# Patient Record
Sex: Male | Born: 1990 | Race: Black or African American | Hispanic: No | Marital: Single | Smoking: Never smoker
Health system: Southern US, Community
[De-identification: ages and names within clinical notes are randomized; demographics above are authoritative.]

## PROBLEM LIST (undated history)

## (undated) DIAGNOSIS — R569 Unspecified convulsions: Secondary | ICD-10-CM

## (undated) HISTORY — PX: HERNIA REPAIR: SHX51

---
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
## 2015-04-20 ENCOUNTER — Emergency Department (HOSPITAL_BASED_OUTPATIENT_CLINIC_OR_DEPARTMENT_OTHER)
Admission: EM | Admit: 2015-04-20 | Discharge: 2015-04-20 | Disposition: A | Discharge status: Home / Self Care | Payer: Managed Care, Other (non HMO) | Attending: Emergency Medicine | Admitting: Emergency Medicine

## 2015-04-20 ENCOUNTER — Encounter (HOSPITAL_BASED_OUTPATIENT_CLINIC_OR_DEPARTMENT_OTHER): Payer: Self-pay

## 2015-04-20 DIAGNOSIS — R4781 Slurred speech: Secondary | ICD-10-CM | POA: Diagnosis not present

## 2015-04-20 DIAGNOSIS — Z79899 Other long term (current) drug therapy: Secondary | ICD-10-CM | POA: Insufficient documentation

## 2015-04-20 DIAGNOSIS — R42 Dizziness and giddiness: Secondary | ICD-10-CM | POA: Diagnosis not present

## 2015-04-20 DIAGNOSIS — R5383 Other fatigue: Secondary | ICD-10-CM | POA: Diagnosis not present

## 2015-04-20 DIAGNOSIS — Z9889 Other specified postprocedural states: Secondary | ICD-10-CM | POA: Diagnosis not present

## 2015-04-20 DIAGNOSIS — R531 Weakness: Secondary | ICD-10-CM | POA: Diagnosis not present

## 2015-04-20 DIAGNOSIS — R400 Somnolence: Secondary | ICD-10-CM | POA: Insufficient documentation

## 2015-04-20 DIAGNOSIS — R112 Nausea with vomiting, unspecified: Secondary | ICD-10-CM | POA: Diagnosis not present

## 2015-04-20 DIAGNOSIS — R51 Headache: Secondary | ICD-10-CM | POA: Diagnosis present

## 2015-04-20 HISTORY — DX: Unspecified convulsions: R56.9

## 2015-04-20 NOTE — Discharge Instructions (Signed)

## 2015-04-20 NOTE — ED Notes (Signed)
Pt states on Monday he felt like he had a virus and had a h/a, nausea and blurred vision.  Denies current complaints, but states he has slurred words currently, which are not appreciated upon exam.  Pt had not taken his keppra in three weeks for unknown reason, states he took his dose today though.

## 2015-04-20 NOTE — ED Provider Notes (Signed)
CSN: [id number]     Arrival date & time 04/20/15  1954 History   First MD Initiated Contact with Patient 04/20/15 2138     Chief Complaint  Patient presents with  . Headache    HPI   25 year old male presents today with numerous complaints. Patient reports that on Monday he had a stomach virus with nausea vomiting and globalized headache. He reports symptoms aside the next day, continued to feel fatigued. Patient denies any fever, neck stiffness, headache, any neurological deficits with the headache. Patient reports that he just recently got his prescription for Keppra took his first dose of 1500 mg this morning. He reports that after that he had some dizziness, drowsiness, and mother reported some slurred speech. Patient denies any focal neurological deficits, reports that he does not feel this is like his previous seizures, denies any fever, headache, neck stiffness. Drug or alcohol use.   Past Medical History  Diagnosis Date  . Seizures (HCC)     last seizure was 2016   Past Surgical History  Procedure Laterality Date  . Hernia repair     No family history on file. Social History  Substance Use Topics  . Smoking status: None  . Smokeless tobacco: None  . Alcohol Use: None     Review of Systems  All other systems reviewed and are negative.   Allergies  Review of patient's allergies indicates no known allergies.  Home Medications   Prior to Admission medications   Medication Sig Start Date End Date Taking? Authorizing Provider  levETIRAcetam (KEPPRA) 750 MG tablet Take 1,500 mg by mouth 2 (two) times daily.   Yes Historical Provider, MD   BP 124/86 mmHg  Pulse 68  Temp(Src) 98.6 F (37 C) (Oral)  Resp 18  Ht  (1.778 m)  Wt 66.225 kg  BMI 20.95 kg/m2  SpO2 100%   Physical Exam  Constitutional: He is oriented to person, place, and time. He appears well-developed and well-nourished.  HENT:  Head: Normocephalic and atraumatic.  Eyes: Conjunctivae are normal.  Pupils are equal, round, and reactive to light. Right eye exhibits no discharge. Left eye exhibits no discharge. No scleral icterus.  Neck: Normal range of motion. No JVD present. No tracheal deviation present.  Pulmonary/Chest: Effort normal. No stridor.  Neurological: He is alert and oriented to person, place, and time. He has normal strength. No cranial nerve deficit or sensory deficit. Coordination normal. GCS eye subscore is 4. GCS verbal subscore is 5. GCS motor subscore is 6.  Reflex Scores:      Patellar reflexes are 2+ on the right side and 2+ on the left side. Psychiatric: He has a normal mood and affect. His behavior is normal. Judgment and thought content normal.  Nursing note and vitals reviewed.   ED Course  Procedures (including critical care time) Labs Review Labs Reviewed - No data to display  Imaging Review No results found. I have personally reviewed and evaluated these images and lab results as part of my medical decision-making.   EKG Interpretation None      MDM   Final diagnoses:  Weakness    Labs:   Imaging:  Consults:  Therapeutics:  Discharge Meds:   Assessment/Plan: 25 year old male presents today with vague complaints. Today's presentation is most likely due to patient restarting his Keppra at home. He was originally started on Keppra and titrated up to the current dose that he took today. He has no focal neurological deficits that would indicate any  intracranial abnormality, acutely life-threatening illness. Patient has no seizure-like activity, is afebrile and nontoxic in no acute distress. I cannot appreciate any slurred speech during my exam, or any other abnormalities. Patient is instructed follow-up with his neurologist tomorrow regarding Keppra dosing, scheduled for evaluation. He is given strict return, patient and his mother verbalized understanding and agreement today's plan and had no further questions or concerns at the time of  discharge. Patient is instructed not to drive up despite date in any potentially harmful activities until he returns to his baseline.        Eyvonne Mechanic, PA-C 04/20/15 2253  Loren Racer, MD 04/23/15 2249

## 2023-01-02 ENCOUNTER — Emergency Department (HOSPITAL_BASED_OUTPATIENT_CLINIC_OR_DEPARTMENT_OTHER)
Admission: EM | Admit: 2023-01-02 | Discharge: 2023-01-02 | Disposition: A | Discharge status: Home / Self Care | Payer: 59 | Attending: Emergency Medicine | Admitting: Emergency Medicine

## 2023-01-02 ENCOUNTER — Emergency Department (HOSPITAL_BASED_OUTPATIENT_CLINIC_OR_DEPARTMENT_OTHER): Payer: 59

## 2023-01-02 ENCOUNTER — Other Ambulatory Visit: Payer: Self-pay

## 2023-01-02 ENCOUNTER — Encounter (HOSPITAL_BASED_OUTPATIENT_CLINIC_OR_DEPARTMENT_OTHER): Payer: Self-pay

## 2023-01-02 DIAGNOSIS — R569 Unspecified convulsions: Secondary | ICD-10-CM | POA: Insufficient documentation

## 2023-01-02 LAB — CBC WITH DIFFERENTIAL/PLATELET
Abs Immature Granulocytes: 0.12 10*3/uL — ABNORMAL HIGH (ref 0.00–0.07)
Basophils Absolute: 0.1 10*3/uL (ref 0.0–0.1)
Basophils Relative: 1 %
Eosinophils Absolute: 0.1 10*3/uL (ref 0.0–0.5)
Eosinophils Relative: 1 %
HCT: 45.6 % (ref 39.0–52.0)
Hemoglobin: 15.4 g/dL (ref 13.0–17.0)
Immature Granulocytes: 1 %
Lymphocytes Relative: 28 %
Lymphs Abs: 3 10*3/uL (ref 0.7–4.0)
MCH: 26.2 pg (ref 26.0–34.0)
MCHC: 33.8 g/dL (ref 30.0–36.0)
MCV: 77.7 fL — ABNORMAL LOW (ref 80.0–100.0)
Monocytes Absolute: 0.6 10*3/uL (ref 0.1–1.0)
Monocytes Relative: 6 %
Neutro Abs: 6.8 10*3/uL (ref 1.7–7.7)
Neutrophils Relative %: 63 %
Platelets: 376 10*3/uL (ref 150–400)
RBC: 5.87 MIL/uL — ABNORMAL HIGH (ref 4.22–5.81)
RDW: 13 % (ref 11.5–15.5)
WBC: 10.6 10*3/uL — ABNORMAL HIGH (ref 4.0–10.5)
nRBC: 0 % (ref 0.0–0.2)

## 2023-01-02 LAB — BASIC METABOLIC PANEL
Anion gap: 10 (ref 5–15)
BUN: 8 mg/dL (ref 6–20)
CO2: 27 mmol/L (ref 22–32)
Calcium: 9.4 mg/dL (ref 8.9–10.3)
Chloride: 99 mmol/L (ref 98–111)
Creatinine, Ser: 0.89 mg/dL (ref 0.61–1.24)
GFR, Estimated: >60 mL/min (ref >60)
Glucose, Bld: 140 mg/dL — ABNORMAL HIGH (ref 70–99)
Potassium: 3.5 mmol/L (ref 3.5–5.1)
Sodium: 136 mmol/L (ref 135–145)

## 2023-01-02 MED ORDER — LEVETIRACETAM 750 MG PO TABS: 750.0000 mg | ORAL_TABLET | Freq: Two times a day (BID) | ORAL | 0 refills | Status: DC

## 2023-01-02 MED ORDER — LEVETIRACETAM IN NACL 1000 MG/100ML IV SOLN
1000.0000 mg | Freq: Once | INTRAVENOUS | Status: AC
  Administered 2023-01-02: 1000 mg via INTRAVENOUS
  Filled 2023-01-02: qty 100

## 2023-01-02 NOTE — Discharge Instructions (Signed)

## 2023-01-02 NOTE — ED Provider Notes (Signed)
Emergency Department Provider Note   I have reviewed the triage vital signs and the nursing notes.   HISTORY  Chief Complaint Seizures   HPI Jimmy Ryan is a 32 y.o. male with past history of seizure, no longer on Keppra, presents to the emergency department with return of breakthrough seizures.  Over the past several weeks he has had several episodes of staring off for 1 to 2 minutes and then waking up very confused and disoriented.  He states in 2014 he had similar symptoms and was started on Keppra, 750 mg twice daily.  He tolerated this well and in 2021 had been seizure-free for some time.  He was taken off of medication and has not followed further with his neurologist.  3  weeks ago he had return of one episode which has since been followed by 2-3 more including one approximately 1 hour prior to ED presentation.  No numbness or weakness.  He does have a mild headache.  No fevers. He would like to re-start his Keppra and needs another referral to Neurology. He does drive but understands that with return of seizure he will need to stop driving immediately.    Past Medical History:  Diagnosis Date   Seizures (HCC)    last seizure was 2016    Review of Systems  Constitutional: No fever/chills Eyes: No visual changes. ENT: No sore throat. Cardiovascular: Denies chest pain. Respiratory: Denies shortness of breath. Gastrointestinal: No abdominal pain.  No nausea, no vomiting.  No diarrhea.  Musculoskeletal: Negative for back pain. Skin: Negative for rash. Neurological: Negative for headaches, focal weakness or numbness. Positive breakthrough seizure.    ____________________________________________   PHYSICAL EXAM:  VITAL SIGNS: ED Triage Vitals  Encounter Vitals Group     BP 01/02/23 2032 (!) 140/96     Pulse Rate 01/02/23 2032 (!) 105     Resp 01/02/23 2032 18     Temp 01/02/23 2032 98.2 F (36.8 C)     Temp Source 01/02/23 2032 Oral     SpO2 01/02/23 2032  98 %     Weight 01/02/23 2038 193 lb (87.5 kg)     Height 01/02/23 2038 5\' 9"  (1.753 m)   Constitutional: Alert and oriented. Well appearing and in no acute distress. Eyes: Conjunctivae are normal. Head: Atraumatic. Nose: No congestion/rhinnorhea. Mouth/Throat: Mucous membranes are moist.  Neck: No stridor.   Cardiovascular: Normal rate, regular rhythm. Good peripheral circulation. Grossly normal heart sounds.   Respiratory: Normal respiratory effort.  No retractions. Lungs CTAB. Gastrointestinal: Soft and nontender. No distention.  Musculoskeletal: No lower extremity tenderness nor edema. No gross deformities of extremities. Neurologic:  Normal speech and language. No gross focal neurologic deficits are appreciated.  Skin:  Skin is warm, dry and intact. No rash noted.  ____________________________________________   LABS (all labs ordered are listed, but only abnormal results are displayed)  Labs Reviewed  CBC WITH DIFFERENTIAL/PLATELET - Abnormal; Notable for the following components:      Result Value   WBC 10.6 (*)    RBC 5.87 (*)    MCV 77.7 (*)    Abs Immature Granulocytes 0.12 (*)    All other components within normal limits  BASIC METABOLIC PANEL - Abnormal; Notable for the following components:   Glucose, Bld 140 (*)    All other components within normal limits   ____________________________________________  EKG   EKG Interpretation Date/Time:  Wednesday January 02 2023 20:36:26 EDT Ventricular Rate:  96 PR Interval:  162  QRS Duration:  96 QT Interval:  339 QTC Calculation: 429 R Axis:   61  Text Interpretation: Incomplete analysis due to missing data in precordial lead(s) Sinus rhythm RSR' in V1 or V2, right VCD or RVH Baseline wander in lead(s) V3 Missing lead(s): V2 and partial missing lead(s): V3 Confirmed by Alona Bene [phone]) on 01/02/2023 8:45:26 PM        ____________________________________________  RADIOLOGY  CT Head Wo Contrast  Result  Date: 01/02/2023 CLINICAL DATA:  New onset seizure activity, initial encounter EXAM: CT HEAD WITHOUT CONTRAST TECHNIQUE: Contiguous axial images were obtained from the base of the skull through the vertex without intravenous contrast. RADIATION DOSE REDUCTION: This exam was performed according to the departmental dose-optimization program which includes automated exposure control, adjustment of the mA and/or kV according to patient size and/or use of iterative reconstruction technique. COMPARISON:  None Available. FINDINGS: Brain: No evidence of acute infarction, hemorrhage, hydrocephalus, extra-axial collection or mass lesion/mass effect. Vascular: No hyperdense vessel or unexpected calcification. Skull: Normal. Negative for fracture or focal lesion. Sinuses/Orbits: No acute finding. Other: None. IMPRESSION: No acute intracranial abnormality noted. Electronically Signed   By: Alcide Clever M.D.   On: 01/02/2023 22:48    ____________________________________________   PROCEDURES  Procedure(s) performed:   Procedures  None  ____________________________________________   INITIAL IMPRESSION / ASSESSMENT AND PLAN / ED COURSE  Pertinent labs & imaging results that were available during my care of the patient were reviewed by me and considered in my medical decision making (see chart for details).   This patient is Presenting for Evaluation of AMS, which does require a range of treatment options, and is a complaint that involves a high risk of morbidity and mortality.  The Differential Diagnoses includes but is not exclusive to alcohol, illicit or prescription medications, intracranial pathology such as stroke, intracerebral hemorrhage, fever or infectious causes including sepsis, hypoxemia, uremia, trauma, endocrine related disorders such as diabetes, hypoglycemia, thyroid-related diseases, etc.   Critical Interventions-    Medications  levETIRAcetam (KEPPRA) IVPB 1000 mg/100 mL premix (0 mg  Intravenous Stopped 01/02/23 2133)    Reassessment after intervention: patient awake and alert.    Clinical Laboratory Tests Ordered, included CBC without anemia. No AKI.   Radiologic Tests Ordered, included CT head. I independently interpreted the images and agree with radiology interpretation.   Cardiac Monitor Tracing which shows NSR.    Social Determinants of Health Risk denies any drugs or EtOH.   Medical Decision Making: Summary:  Patient presents emergency department with breakthrough seizures intermittently over the past 2 to 3 weeks.  He was previously on Keppra 750 mg twice daily.  Plan to restart this with Keppra load here.  Will refer to neurology.  Reevaluation with update and discussion with patient. Plan to restart Keppra at 750 mg BID, patient's reported prior dose, and refer to Cardiology. Patient given strict no driving precautions.   Considered admission but no evidence of status epilepticus. Stable for discharge.   Patient's presentation is most consistent with acute presentation with potential threat to life or bodily function.   Disposition: discharge  ____________________________________________  FINAL CLINICAL IMPRESSION(S) / ED DIAGNOSES  Final diagnoses:  Seizure (HCC)     NEW OUTPATIENT MEDICATIONS STARTED DURING THIS VISIT:  New Prescriptions   LEVETIRACETAM (KEPPRA) 750 MG TABLET    Take 1 tablet (750 mg total) by mouth 2 (two) times daily.    Note:  This document was prepared using Conservation officer, historic buildings and may  include unintentional dictation errors.  Alona Bene, MD, [facility]    Tiona Ruane, Arlyss Repress, MD 01/02/23 [phone]

## 2023-01-02 NOTE — ED Notes (Signed)
Seizure pads on bed

## 2023-01-02 NOTE — ED Triage Notes (Signed)
Pt presents with complaints of seizure-like activities 30 mins ago (eye twitching, facial muscle tension, confusion). Pt states he has a history of the same and was treated with Keppra but was discontinued this 3 years ago. Patient had first episode returned 3 weeks ago.

## 2023-03-22 ENCOUNTER — Ambulatory Visit (INDEPENDENT_AMBULATORY_CARE_PROVIDER_SITE_OTHER): Payer: 59 | Admitting: Neurology

## 2023-03-22 ENCOUNTER — Encounter: Payer: Self-pay | Admitting: Neurology

## 2023-03-22 VITALS — BP 132/74 | Ht 69.0 in | Wt 200.0 lb

## 2023-03-22 DIAGNOSIS — G40009 Localization-related (focal) (partial) idiopathic epilepsy and epileptic syndromes with seizures of localized onset, not intractable, without status epilepticus: Secondary | ICD-10-CM | POA: Diagnosis not present

## 2023-03-22 DIAGNOSIS — Z5181 Encounter for therapeutic drug level monitoring: Secondary | ICD-10-CM | POA: Diagnosis not present

## 2023-03-22 MED ORDER — LEVETIRACETAM 750 MG PO TABS: 750.0000 mg | ORAL_TABLET | Freq: Two times a day (BID) | ORAL | 4 refills | Status: DC

## 2023-03-22 NOTE — Patient Instructions (Addendum)
 Continue with Keppra 750 mg twice daily, refill given Will check a Keppra level Routine EEG Follow-up in 1 year or sooner if worse Discussed driving restriction for neck 6 months.

## 2023-03-22 NOTE — Progress Notes (Signed)
 [facility]  PATIENT: Jimmy Ryan DOB: 1990-07-21  REQUESTING CLINICIAN: Long, Fonda MATSU, MD HISTORY FROM: Patient  REASON FOR VISIT: Here to establish care for Epilepsy    HISTORICAL  CHIEF COMPLAINT:  Chief Complaint  Patient presents with   New Patient (Initial Visit)    Rm 12, NP, sz activity, last sz October 2023, staring sz, facial numbness and confusion, denies SI/HI    HISTORY OF PRESENT ILLNESS:  This is a 33 year old gentleman past medical history epilepsy who is presenting to establish care.  He tells me that his seizures started in 2014.  His seizures are described as staring into space, facial muscle twitching, sometimes hands contracting and he will feel weird and confused afterward.  He denies any type of convulsion, denies any injury from his seizures.  He was put on Keppra , did well from 2014 to 2019, he tells me that he was seizure-free.  In 2019 he was taken off of Keppra  because he was doing so well.  He continued to do well until September 2024 when he started having reoccurrence of his seizures.  In October he presented to the hospital after another breakthrough seizure, was put on Keppra , he did well for that next month and run out of medication and in December he had another breakthrough seizure.  He ended going to urgent care and was put back on Keppra .  Since being back on the medication, he is doing well, denies any side effect from the medication.  He denies any seizure risk factors other than a maternal cousin with seizures and reported history of concussion as a young teenager.   Handedness: Left handed   Onset: 2014  Seizure Type: Staring in space, facial muscle twitching and confusion afterward.  Denies any convulsion.  Current frequency: Last episode in December in the setting of running out of medication, prior to that was in October when he was off the Keppra .  Any injuries from seizures: Denies  Seizure risk factors:  Maternal cousin, History of head injury   Previous ASMs: Levetiracetam   Currenty ASMs: Levetiracetam  750 mg twice daily  ASMs side effects: Fatigue but resolved now  Brain Images: Normal MRI and normal head CT  Previous EEGs: Not available for review   OTHER MEDICAL CONDITIONS: epilepsy, sleep apnea   REVIEW OF SYSTEMS: Full 14 system review of systems performed and negative with exception of: As noted in the HPI  ALLERGIES: No Known Allergies  HOME MEDICATIONS: Outpatient Medications Prior to Visit  Medication Sig Dispense Refill   levETIRAcetam  (KEPPRA ) 750 MG tablet Take 1 tablet (750 mg total) by mouth 2 (two) times daily. 60 tablet 0   No facility-administered medications prior to visit.    PAST MEDICAL HISTORY: Past Medical History:  Diagnosis Date   Seizures (HCC)    last seizure was 2016    PAST SURGICAL HISTORY: Past Surgical History:  Procedure Laterality Date   HERNIA REPAIR      FAMILY HISTORY: History reviewed. No pertinent family history.  SOCIAL HISTORY: Social History   Socioeconomic History   Marital status: Single    Spouse name: Not on file   Number of children: Not on file   Years of education: Not on file   Highest education level: Not on file  Occupational History   Not on file  Tobacco Use   Smoking status: Never   Smokeless tobacco: Never  Substance and Sexual Activity   Alcohol use: Not Currently   Drug use: Not  Currently   Sexual activity: Not on file  Other Topics Concern   Not on file  Social History Narrative   Security at [employer]   Left handed   Caffeine-1-2 cups daily   Single, lives with family   Social Drivers of Corporate Investment Banker Strain: Not on file  Food Insecurity: Not on file  Transportation Needs: Not on file  Physical Activity: Not on file  Stress: Not on file  Social Connections: Not on file  Intimate Partner Violence: Not on file    PHYSICAL EXAM  GENERAL EXAM/CONSTITUTIONAL: Vitals:   Vitals:   03/22/23 0922  BP: 132/74  Weight: 200 lb (90.7 kg)  Height: 5' 9 (1.753 m)   Body mass index is 29.53 kg/m. Wt Readings from Last 3 Encounters:  03/22/23 200 lb (90.7 kg)  01/02/23 193 lb (87.5 kg)  04/20/15 146 lb (66.2 kg)   Patient is in no distress; well developed, nourished and groomed; neck is supple  MUSCULOSKELETAL: Gait, strength, tone, movements noted in Neurologic exam below  NEUROLOGIC: MENTAL STATUS:      No data to display         awake, alert, oriented to person, place and time recent and remote memory intact normal attention and concentration language fluent, comprehension intact, naming intact fund of knowledge appropriate  CRANIAL NERVE:  2nd, 3rd, 4th, 6th - Visual fields full to confrontation, extraocular muscles intact, no nystagmus 5th - facial sensation symmetric 7th - facial strength symmetric 8th - hearing intact 9th - palate elevates symmetrically, uvula midline 11th - shoulder shrug symmetric 12th - tongue protrusion midline  MOTOR:  normal bulk and tone, full strength in the BUE, BLE  SENSORY:  normal and symmetric to light touch  COORDINATION:  finger-nose-finger, fine finger movements normal  GAIT/STATION:  normal   DIAGNOSTIC DATA (LABS, IMAGING, TESTING) - I reviewed patient records, labs, notes, testing and imaging myself where available.  Lab Results  Component Value Date   WBC 10.6 (H) 01/02/2023   HGB 15.4 01/02/2023   HCT 45.6 01/02/2023   MCV 77.7 (L) 01/02/2023   PLT 376 01/02/2023      Component Value Date/Time   NA 136 01/02/2023 2053   K 3.5 01/02/2023 2053   CL 99 01/02/2023 2053   CO2 27 01/02/2023 2053   GLUCOSE 140 (H) 01/02/2023 2053   BUN 8 01/02/2023 2053   CREATININE 0.89 01/02/2023 2053   CALCIUM 9.4 01/02/2023 2053   GFRNONAA >60 01/02/2023 2053   No results found for: CHOL, HDL, LDLCALC, LDLDIRECT, TRIG No results found for: HGBA1C No results found for:  VITAMINB12 No results found for: TSH  Head CT 01/02/2023 No acute intracranial abnormality noted.   I personally reviewed brain Images  ASSESSMENT AND PLAN  33 y.o. year old male  with history of epilepsy who is presenting to establish care.  He is doing well on Keppra  750 mg twice daily, I will check a level and keep him on Keppra  750 mg twice daily.  Will also obtain routine EEG for background assessment.  I will contact him to go over the results.  Advised him to contact me if he does have a breakthrough seizure or any other questions or concerns.  We also discussed driving restriction for the next 6 months.  He voiced understanding.  Will see in 1 year for follow-up or sooner if worse.   1. Partial idiopathic epilepsy with seizures of localized onset, not intractable, without status epilepticus (  HCC)   2. Therapeutic drug monitoring     Patient Instructions  Continue with Keppra  750 mg twice daily, refill given Will check a Keppra  level Routine EEG Follow-up in 1 year or sooner if worse Discussed driving restriction for neck 6 months.   Per [place]  DMV statutes, patients with seizures are not allowed to drive until they have been seizure-free for six months.  Other recommendations include using caution when using heavy equipment or power tools. Avoid working on ladders or at heights. Take showers instead of baths.  Do not swim alone.  Ensure the water temperature is not too high on the home water heater. Do not go swimming alone. Do not lock yourself in a room alone (i.e. bathroom). When caring for infants or small children, sit down when holding, feeding, or changing them to minimize risk of injury to the child in the event you have a seizure. Maintain good sleep hygiene. Avoid alcohol.  Also recommend adequate sleep, hydration, good diet and minimize stress.   During the Seizure  - First, ensure adequate ventilation and place patients on the floor on their left side   Loosen clothing around the neck and ensure the airway is patent. If the patient is clenching the teeth, do not force the mouth open with any object as this can cause severe damage - Remove all items from the surrounding that can be hazardous. The patient may be oblivious to what's happening and may not even know what he or she is doing. If the patient is confused and wandering, either gently guide him/her away and block access to outside areas - Reassure the individual and be comforting - Call 911. In most cases, the seizure ends before EMS arrives. However, there are cases when seizures may last over 3 to 5 minutes. Or the individual may have developed breathing difficulties or severe injuries. If a pregnant patient or a person with diabetes develops a seizure, it is prudent to call an ambulance. - Finally, if the patient does not regain full consciousness, then call EMS. Most patients will remain confused for about 45 to 90 minutes after a seizure, so you must use judgment in calling for help. - Avoid restraints but make sure the patient is in a bed with padded side rails - Place the individual in a lateral position with the neck slightly flexed; this will help the saliva drain from the mouth and prevent the tongue from falling backward - Remove all nearby furniture and other hazards from the area - Provide verbal assurance as the individual is regaining consciousness - Provide the patient with privacy if possible - Call for help and start treatment as ordered by the caregiver   After the Seizure (Postictal Stage)  After a seizure, most patients experience confusion, fatigue, muscle pain and/or a headache. Thus, one should permit the individual to sleep. For the next few days, reassurance is essential. Being calm and helping reorient the person is also of importance.  Most seizures are painless and end spontaneously. Seizures are not harmful to others but can lead to complications such as stress  on the lungs, brain and the heart. Individuals with prior lung problems may develop labored breathing and respiratory distress.    Discussed Patients with epilepsy have a small risk of sudden unexpected death, a condition referred to as sudden unexpected death in epilepsy (SUDEP). SUDEP is defined specifically as the sudden, unexpected, witnessed or unwitnessed, nontraumatic and nondrowning death in patients with epilepsy with or without  evidence for a seizure, and excluding documented status epilepticus, in which post mortem examination does not reveal a structural or toxicologic cause for death     Orders Placed This Encounter  Procedures   Levetiracetam  level   EEG adult    Meds ordered this encounter  Medications   levETIRAcetam  (KEPPRA ) 750 MG tablet    Sig: Take 1 tablet (750 mg total) by mouth 2 (two) times daily.    Dispense:  180 tablet    Refill:  4    Return in about 1 year (around 03/21/2024).    Pastor Falling, MD 03/22/2023, 9:56 AM  [facility] [address], [address] [place], [place] [address] [phone]

## 2023-03-24 LAB — LEVETIRACETAM LEVEL: Levetiracetam Lvl: 26.5 ug/mL (ref 10.0–40.0)

## 2023-04-10 ENCOUNTER — Telehealth: Payer: Self-pay | Admitting: Neurology

## 2023-04-10 NOTE — Telephone Encounter (Signed)
Pt rescheduled appointment due work schedule conflict, will be in [place]

## 2023-04-17 ENCOUNTER — Other Ambulatory Visit: Payer: 59 | Admitting: *Deleted

## 2023-04-25 ENCOUNTER — Other Ambulatory Visit: Payer: 59 | Admitting: *Deleted

## 2023-05-08 ENCOUNTER — Ambulatory Visit (INDEPENDENT_AMBULATORY_CARE_PROVIDER_SITE_OTHER): Payer: 59 | Admitting: Neurology

## 2023-05-08 ENCOUNTER — Encounter: Payer: Self-pay | Admitting: Neurology

## 2023-05-08 DIAGNOSIS — G40009 Localization-related (focal) (partial) idiopathic epilepsy and epileptic syndromes with seizures of localized onset, not intractable, without status epilepticus: Secondary | ICD-10-CM

## 2023-05-08 NOTE — Procedures (Signed)
    History:  33 year old man with epilepsy   EEG classification: Awake and drowsy  Duration: 25 minutes   Technical aspects: This EEG study was done with scalp electrodes positioned according to the 10-20 International system of electrode placement. Electrical activity was reviewed with band pass filter of 1-70Hz , sensitivity of 7 uV/mm, display speed of 79mm/sec with a 60Hz  notched filter applied as appropriate. EEG data were recorded continuously and digitally stored.   Description of the recording: The background rhythms of this recording consists of a fairly well modulated medium amplitude alpha rhythm of 11 Hz that is reactive to eye opening and closure. Present in the anterior head region is a 15-20 Hz beta activity. Photic stimulation was performed, did not show any abnormalities. Hyperventilation was also performed, did not show any abnormalities. Drowsiness was manifested by background fragmentation. No abnormal epileptiform discharges seen during this recording. There was no focal slowing. There were no electrographic seizure identified.   Abnormality: None   Impression: This is a normal awake and drowsy EEG. No evidence of interictal epileptiform discharges. Normal EEGs, however, do not rule out epilepsy.    Windell Norfolk, MD [facility]

## 2023-08-14 ENCOUNTER — Telehealth: Payer: Self-pay | Admitting: Neurology

## 2023-08-14 NOTE — Telephone Encounter (Signed)
 LVM and sent MyChart msg informing pt of r/s needed for 03/24/24 appt- MD out.

## 2024-03-24 ENCOUNTER — Ambulatory Visit: Payer: 59 | Admitting: Neurology

## 2024-04-15 ENCOUNTER — Other Ambulatory Visit: Payer: Self-pay | Admitting: Neurology
